# Patient Record
Sex: Female | Born: 1980 | Race: Black or African American | Hispanic: No | Marital: Single | Smoking: Never smoker
Health system: Southern US, Community
[De-identification: ages and names within clinical notes are randomized; demographics above are authoritative.]

## PROBLEM LIST (undated history)

## (undated) DIAGNOSIS — I10 Essential (primary) hypertension: Secondary | ICD-10-CM

## (undated) HISTORY — PX: OVARIAN CYST REMOVAL: SHX89

## (undated) HISTORY — PX: TUBAL LIGATION: SHX77

---
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
## 2013-10-20 ENCOUNTER — Emergency Department (HOSPITAL_BASED_OUTPATIENT_CLINIC_OR_DEPARTMENT_OTHER): Payer: Medicaid Other

## 2013-10-20 ENCOUNTER — Emergency Department (HOSPITAL_BASED_OUTPATIENT_CLINIC_OR_DEPARTMENT_OTHER)
Admission: EM | Admit: 2013-10-20 | Discharge: 2013-10-20 | Disposition: A | Discharge status: Home / Self Care | Payer: Medicaid Other | Attending: Emergency Medicine | Admitting: Emergency Medicine

## 2013-10-20 ENCOUNTER — Encounter (HOSPITAL_BASED_OUTPATIENT_CLINIC_OR_DEPARTMENT_OTHER): Payer: Self-pay | Admitting: Emergency Medicine

## 2013-10-20 DIAGNOSIS — I10 Essential (primary) hypertension: Secondary | ICD-10-CM | POA: Diagnosis not present

## 2013-10-20 DIAGNOSIS — Z79899 Other long term (current) drug therapy: Secondary | ICD-10-CM | POA: Insufficient documentation

## 2013-10-20 DIAGNOSIS — M25539 Pain in unspecified wrist: Secondary | ICD-10-CM | POA: Insufficient documentation

## 2013-10-20 DIAGNOSIS — M25531 Pain in right wrist: Secondary | ICD-10-CM

## 2013-10-20 DIAGNOSIS — Z880 Allergy status to penicillin: Secondary | ICD-10-CM | POA: Insufficient documentation

## 2013-10-20 DIAGNOSIS — Z791 Long term (current) use of non-steroidal anti-inflammatories (NSAID): Secondary | ICD-10-CM | POA: Insufficient documentation

## 2013-10-20 DIAGNOSIS — M25549 Pain in joints of unspecified hand: Secondary | ICD-10-CM | POA: Insufficient documentation

## 2013-10-20 HISTORY — DX: Essential (primary) hypertension: I10

## 2013-10-20 LAB — CBC WITH DIFFERENTIAL/PLATELET
Basophils Absolute: 0 10*3/uL (ref 0.0–0.1)
Basophils Relative: 0 % (ref 0–1)
Eosinophils Absolute: 0.1 10*3/uL (ref 0.0–0.7)
Eosinophils Relative: 1 % (ref 0–5)
HEMATOCRIT: 33 % — AB (ref 36.0–46.0)
HEMOGLOBIN: 10.6 g/dL — AB (ref 12.0–15.0)
LYMPHS PCT: 36 % (ref 12–46)
Lymphs Abs: 3.6 10*3/uL (ref 0.7–4.0)
MCH: 26.5 pg (ref 26.0–34.0)
MCHC: 32.1 g/dL (ref 30.0–36.0)
MCV: 82.5 fL (ref 78.0–100.0)
MONO ABS: 0.7 10*3/uL (ref 0.1–1.0)
MONOS PCT: 7 % (ref 3–12)
NEUTROS ABS: 5.5 10*3/uL (ref 1.7–7.7)
NEUTROS PCT: 55 % (ref 43–77)
Platelets: 299 10*3/uL (ref 150–400)
RBC: 4 MIL/uL (ref 3.87–5.11)
RDW: 14.6 % (ref 11.5–15.5)
WBC: 10 10*3/uL (ref 4.0–10.5)

## 2013-10-20 LAB — BASIC METABOLIC PANEL
ANION GAP: 11 (ref 5–15)
BUN: 8 mg/dL (ref 6–23)
CO2: 24 meq/L (ref 19–32)
CREATININE: 0.8 mg/dL (ref 0.50–1.10)
Calcium: 9.4 mg/dL (ref 8.4–10.5)
Chloride: 104 mEq/L (ref 96–112)
GFR calc Af Amer: >90 mL/min (ref >90)
GFR calc non Af Amer: >90 mL/min (ref >90)
Glucose, Bld: 110 mg/dL — ABNORMAL HIGH (ref 70–99)
POTASSIUM: 4.1 meq/L (ref 3.7–5.3)
Sodium: 139 mEq/L (ref 137–147)

## 2013-10-20 MED ORDER — NAPROXEN 500 MG PO TABS: 500.0000 mg | ORAL_TABLET | Freq: Two times a day (BID) | ORAL | Status: AC

## 2013-10-20 MED ORDER — HYDROCODONE-ACETAMINOPHEN 5-325 MG PO TABS: 1.0000 | ORAL_TABLET | Freq: Four times a day (QID) | ORAL | Status: AC | PRN

## 2013-10-20 MED ORDER — HYDROCODONE-ACETAMINOPHEN 5-325 MG PO TABS
1.0000 | ORAL_TABLET | Freq: Once | ORAL | Status: AC
  Administered 2013-10-20: 1 via ORAL
  Filled 2013-10-20: qty 1

## 2013-10-20 NOTE — ED Provider Notes (Signed)
CSN: [id number]     Arrival date & time 10/20/13  1256 History   First MD Initiated Contact with Patient 10/20/13 1303     Chief Complaint  Patient presents with  . Hand Pain   HPI Comments: Pain is characterized as burning and squeezing. Patient denies any injury or previous problems with plan.  Patient denies history of gout.  Patient drinks on the weekend and eats a diet high in fatty fried foods.  She states that she mostly eats fried chicken.  Patient is a 33 y.o. female presenting with hand pain. The history is provided by the patient. No language interpreter was used.  Hand Pain This is a new problem. The current episode started today. The problem occurs constantly. The problem has been gradually worsening. Associated symptoms include joint swelling. Pertinent negatives include no abdominal pain, arthralgias, change in bowel habit, chest pain, chills, diaphoresis, fatigue, fever, myalgias, nausea, neck pain, numbness, rash, vomiting or weakness. The symptoms are aggravated by bending (pressure, touching). She has tried NSAIDs and heat for the symptoms. The treatment provided mild relief.    Past Medical History  Diagnosis Date  . Hypertension    Past Surgical History  Procedure Laterality Date  . Tubal ligation     No family history on file. History  Substance Use Topics  . Smoking status: Never Smoker   . Smokeless tobacco: Not on file  . Alcohol Use: Yes   OB History   Grav Para Term Preterm Abortions TAB SAB Ect Mult Living                 Review of Systems  Constitutional: Negative for fever, chills, diaphoresis and fatigue.  Cardiovascular: Negative for chest pain.  Gastrointestinal: Negative for nausea, vomiting, abdominal pain and change in bowel habit.  Musculoskeletal: Positive for joint swelling. Negative for arthralgias, myalgias and neck pain.  Skin: Negative for rash.  Neurological: Negative for weakness and numbness.      Allergies   Penicillins  Home Medications   Prior to Admission medications   Medication Sig Start Date End Date Taking? Authorizing Provider  HYDROcodone-acetaminophen (NORCO/VICODIN) 5-325 MG per tablet Take 1 tablet by mouth every 6 (six) hours as needed for moderate pain or severe pain. 10/20/13   Canyon Lohr A Forcucci, PA-C  naproxen (NAPROSYN) 500 MG tablet Take 1 tablet (500 mg total) by mouth 2 (two) times daily. 10/20/13   Abou Sterkel A Forcucci, PA-C   BP 162/110  Pulse 82  Temp(Src) 98.8 F (37.1 C) (Oral)  Resp 18  Ht 5\' 1"  (1.549 m)  Wt 245 lb (111.131 kg)  BMI 46.32 kg/m2  SpO2 100%  LMP 10/05/2013 Physical Exam  Nursing note and vitals reviewed. Constitutional: She is oriented to person, place, and time. She appears well-developed and well-nourished. No distress.  HENT:  Head: Normocephalic and atraumatic.  Mouth/Throat: Oropharynx is clear and moist. No oropharyngeal exudate.  Eyes: Conjunctivae and EOM are normal. Pupils are equal, round, and reactive to light. No scleral icterus.  Neck: Normal range of motion. Neck supple. No JVD present. No thyromegaly present.  Cardiovascular: Normal rate, regular rhythm, normal heart sounds and intact distal pulses.  Exam reveals no gallop and no friction rub.   No murmur heard. Pulses:      Radial pulses are 2+ on the right side, and 2+ on the left side.  Pulmonary/Chest: Effort normal and breath sounds normal. No respiratory distress. She has no wheezes. She has no rales. She exhibits no  tenderness.  Musculoskeletal:       Right elbow: She exhibits normal range of motion, no swelling, no effusion, no deformity and no laceration. No tenderness found.       Right wrist: She exhibits decreased range of motion, tenderness and swelling. She exhibits no bony tenderness, no effusion, no crepitus, no deformity and no laceration.       Right hand: She exhibits decreased range of motion, tenderness and swelling. She exhibits no bony tenderness, normal  two-point discrimination, normal capillary refill, no deformity and no laceration. Normal sensation noted. Decreased sensation is not present in the ulnar distribution, is not present in the medial distribution and is not present in the radial distribution. Normal strength noted. She exhibits no finger abduction, no thumb/finger opposition and no wrist extension trouble.  Hand and wrist are without erythema and rash.  Wrist is mildly warm to the touch.  ROM is decreased due to pain but is able to be actively and passively moved.    Lymphadenopathy:    She has no cervical adenopathy.  Neurological: She is alert and oriented to person, place, and time.  Skin: Skin is warm and dry. No rash noted. She is not diaphoretic.  Psychiatric: She has a normal mood and affect. Her behavior is normal. Judgment and thought content normal.    ED Course  Procedures (including critical care time) Labs Review Labs Reviewed  CBC WITH DIFFERENTIAL - Abnormal; Notable for the following:    Hemoglobin 10.6 (*)    HCT 33.0 (*)    All other components within normal limits  BASIC METABOLIC PANEL - Abnormal; Notable for the following:    Glucose, Bld 110 (*)    All other components within normal limits    Imaging Review Dg Hand Complete Right  10/20/2013   CLINICAL DATA:  Posterior right hand pain.  Swelling.  EXAM: RIGHT HAND - COMPLETE 3+ VIEW  COMPARISON:  None.  FINDINGS: Imaged bones, joints and soft tissues appear normal. And. Negative exam.  IMPRESSION: Negative.   Electronically Signed   By: Drusilla Kanner M.D.   On: 10/20/2013 13:28     EKG Interpretation None      MDM   Final diagnoses:  Right wrist pain   Patient presents to the ED with right hand pain x 1 day.  Patient denies any injury.  Plain film xray here without any abnormalities at this time.  CBC shows no signs of leukocytosis at this time.  Given history of HTN and non-compliance of HTN medications BMP was checked which shows no signs  of abnormality at this time.  Differential at this time includes gout and septic joint.  Given ability to move the joint, non-toxic appearance, and normal white count suspect that there is low likelihood of septic joint.  Will treat the patient with a thumb spica brace for comfort.  Will treat as if gout and will given naproxen BID 500 mg and will give a handful of hydrocodone for pain relief.  Patient to follow-up with ortho at this time.  Patient was given strict return precautions of septic joint.  She states understanding and agreement to the above plan.  Patient was discussed with Dr. Wilkie Aye at this time who agrees with the above workup. Patient was encouraged to also follow-up with her PCP for further BP management.  Patient is stable at this time for discharge.      Eben Burow, PA-C 10/20/13 1523

## 2013-10-20 NOTE — ED Notes (Signed)
Pt reports pain to right hand and swelling. Denies injury.

## 2013-10-20 NOTE — Discharge Instructions (Signed)

## 2013-10-21 NOTE — ED Provider Notes (Signed)
Medical screening examination/treatment/procedure(s) were performed by non-physician practitioner and as supervising physician I was immediately available for consultation/collaboration.   EKG Interpretation None        Shon Batonourtney F Harwood Nall, MD 10/21/13 0700

## 2016-10-26 ENCOUNTER — Encounter (HOSPITAL_BASED_OUTPATIENT_CLINIC_OR_DEPARTMENT_OTHER): Payer: Self-pay | Admitting: *Deleted

## 2016-10-26 ENCOUNTER — Emergency Department (HOSPITAL_BASED_OUTPATIENT_CLINIC_OR_DEPARTMENT_OTHER)
Admission: EM | Admit: 2016-10-26 | Discharge: 2016-10-26 | Disposition: A | Discharge status: Home / Self Care | Payer: Medicaid Other | Attending: Emergency Medicine | Admitting: Emergency Medicine

## 2016-10-26 ENCOUNTER — Emergency Department (HOSPITAL_BASED_OUTPATIENT_CLINIC_OR_DEPARTMENT_OTHER): Payer: Medicaid Other

## 2016-10-26 DIAGNOSIS — R451 Restlessness and agitation: Secondary | ICD-10-CM | POA: Insufficient documentation

## 2016-10-26 DIAGNOSIS — F10920 Alcohol use, unspecified with intoxication, uncomplicated: Secondary | ICD-10-CM

## 2016-10-26 DIAGNOSIS — S0990XA Unspecified injury of head, initial encounter: Secondary | ICD-10-CM | POA: Diagnosis present

## 2016-10-26 DIAGNOSIS — I10 Essential (primary) hypertension: Secondary | ICD-10-CM | POA: Insufficient documentation

## 2016-10-26 DIAGNOSIS — Y999 Unspecified external cause status: Secondary | ICD-10-CM | POA: Diagnosis not present

## 2016-10-26 DIAGNOSIS — Y9301 Activity, walking, marching and hiking: Secondary | ICD-10-CM | POA: Diagnosis not present

## 2016-10-26 DIAGNOSIS — T07XXXA Unspecified multiple injuries, initial encounter: Secondary | ICD-10-CM

## 2016-10-26 DIAGNOSIS — S3992XA Unspecified injury of lower back, initial encounter: Secondary | ICD-10-CM | POA: Diagnosis not present

## 2016-10-26 DIAGNOSIS — S0181XA Laceration without foreign body of other part of head, initial encounter: Secondary | ICD-10-CM

## 2016-10-26 DIAGNOSIS — Y908 Blood alcohol level of 240 mg/100 ml or more: Secondary | ICD-10-CM | POA: Diagnosis not present

## 2016-10-26 DIAGNOSIS — Y929 Unspecified place or not applicable: Secondary | ICD-10-CM | POA: Insufficient documentation

## 2016-10-26 DIAGNOSIS — F10229 Alcohol dependence with intoxication, unspecified: Secondary | ICD-10-CM | POA: Diagnosis not present

## 2016-10-26 DIAGNOSIS — M25511 Pain in right shoulder: Secondary | ICD-10-CM | POA: Insufficient documentation

## 2016-10-26 DIAGNOSIS — S01111A Laceration without foreign body of right eyelid and periocular area, initial encounter: Secondary | ICD-10-CM | POA: Diagnosis not present

## 2016-10-26 DIAGNOSIS — W0110XA Fall on same level from slipping, tripping and stumbling with subsequent striking against unspecified object, initial encounter: Secondary | ICD-10-CM | POA: Insufficient documentation

## 2016-10-26 LAB — CBC WITH DIFFERENTIAL/PLATELET
Basophils Absolute: 0 10*3/uL (ref 0.0–0.1)
Basophils Relative: 0 %
Eosinophils Absolute: 0.1 10*3/uL (ref 0.0–0.7)
Eosinophils Relative: 1 %
HEMATOCRIT: 33.8 % — AB (ref 36.0–46.0)
Hemoglobin: 11 g/dL — ABNORMAL LOW (ref 12.0–15.0)
Lymphocytes Relative: 61 %
Lymphs Abs: 7.4 10*3/uL — ABNORMAL HIGH (ref 0.7–4.0)
MCH: 26.8 pg (ref 26.0–34.0)
MCHC: 32.5 g/dL (ref 30.0–36.0)
MCV: 82.4 fL (ref 78.0–100.0)
MONO ABS: 0.8 10*3/uL (ref 0.1–1.0)
MONOS PCT: 7 %
Neutro Abs: 3.8 10*3/uL (ref 1.7–7.7)
Neutrophils Relative %: 31 %
Platelets: 358 10*3/uL (ref 150–400)
RBC: 4.1 MIL/uL (ref 3.87–5.11)
RDW: 14.5 % (ref 11.5–15.5)
WBC: 12.1 10*3/uL — ABNORMAL HIGH (ref 4.0–10.5)

## 2016-10-26 LAB — BASIC METABOLIC PANEL
Anion gap: 10 (ref 5–15)
BUN: 12 mg/dL (ref 6–20)
CO2: 21 mmol/L — AB (ref 22–32)
CREATININE: 0.86 mg/dL (ref 0.44–1.00)
Calcium: 8.7 mg/dL — ABNORMAL LOW (ref 8.9–10.3)
Chloride: 105 mmol/L (ref 101–111)
GFR calc Af Amer: >60 mL/min (ref >60)
GFR calc non Af Amer: >60 mL/min (ref >60)
GLUCOSE: 111 mg/dL — AB (ref 65–99)
Potassium: 3.4 mmol/L — ABNORMAL LOW (ref 3.5–5.1)
Sodium: 136 mmol/L (ref 135–145)

## 2016-10-26 LAB — RAPID URINE DRUG SCREEN, HOSP PERFORMED
Amphetamines: NOT DETECTED
BARBITURATES: NOT DETECTED
Benzodiazepines: NOT DETECTED
Cocaine: NOT DETECTED
Opiates: NOT DETECTED
Tetrahydrocannabinol: NOT DETECTED

## 2016-10-26 LAB — ETHANOL: Alcohol, Ethyl (B): 245 mg/dL — ABNORMAL HIGH (ref <5)

## 2016-10-26 LAB — SALICYLATE LEVEL: Salicylate Lvl: <7 mg/dL (ref 2.8–30.0)

## 2016-10-26 LAB — PREGNANCY, URINE: PREG TEST UR: NEGATIVE

## 2016-10-26 LAB — ACETAMINOPHEN LEVEL

## 2016-10-26 MED ORDER — TRAMADOL HCL 50 MG PO TABS: 50.0000 mg | ORAL_TABLET | Freq: Four times a day (QID) | ORAL | 0 refills | Status: AC | PRN

## 2016-10-26 MED ORDER — TETANUS-DIPHTH-ACELL PERTUSSIS 5-2.5-18.5 LF-MCG/0.5 IM SUSP
0.5000 mL | Freq: Once | INTRAMUSCULAR | Status: AC
  Administered 2016-10-26: 0.5 mL via INTRAMUSCULAR
  Filled 2016-10-26: qty 0.5

## 2016-10-26 MED ORDER — LIDOCAINE-EPINEPHRINE-TETRACAINE (LET) SOLUTION
3.0000 mL | Freq: Once | NASAL | Status: AC
  Administered 2016-10-26: 3 mL via TOPICAL
  Filled 2016-10-26: qty 3

## 2016-10-26 MED ORDER — ACETAMINOPHEN 500 MG PO TABS
1000.0000 mg | ORAL_TABLET | Freq: Once | ORAL | Status: AC
  Administered 2016-10-26: 1000 mg via ORAL
  Filled 2016-10-26: qty 2

## 2016-10-26 MED ORDER — ONDANSETRON HCL 4 MG/2ML IJ SOLN
4.0000 mg | Freq: Once | INTRAMUSCULAR | Status: AC
  Administered 2016-10-26: 4 mg via INTRAVENOUS
  Filled 2016-10-26: qty 2

## 2016-10-26 MED ORDER — LIDOCAINE VISCOUS 2 % MT SOLN
15.0000 mL | Freq: Once | OROMUCOSAL | Status: AC
  Administered 2016-10-26: 15 mL via OROMUCOSAL
  Filled 2016-10-26: qty 15

## 2016-10-26 MED ORDER — SODIUM CHLORIDE 0.9 % IV BOLUS (SEPSIS)
1000.0000 mL | Freq: Once | INTRAVENOUS | Status: AC
  Administered 2016-10-26: 1000 mL via INTRAVENOUS

## 2016-10-26 MED ORDER — LIDOCAINE-EPINEPHRINE 2 %-1:100000 IJ SOLN
INTRAMUSCULAR | Status: AC
  Administered 2016-10-26: 1 mL
  Filled 2016-10-26: qty 1

## 2016-10-26 MED ORDER — IOPAMIDOL (ISOVUE-300) INJECTION 61%
100.0000 mL | Freq: Once | INTRAVENOUS | Status: AC | PRN
  Administered 2016-10-26: 100 mL via INTRAVENOUS

## 2016-10-26 MED ORDER — KETOROLAC TROMETHAMINE 30 MG/ML IJ SOLN: 15.0000 mg | Freq: Once | INTRAMUSCULAR | Status: DC

## 2016-10-26 MED ORDER — SODIUM CHLORIDE 0.9 % IV BOLUS (SEPSIS)
1000.0000 mL | Freq: Once | INTRAVENOUS | Status: AC
Start: 2016-10-26 — End: 2016-10-26
  Administered 2016-10-26: 1000 mL via INTRAVENOUS

## 2016-10-26 MED ORDER — SULFAMETHOXAZOLE-TRIMETHOPRIM 800-160 MG PO TABS: 1.0000 | ORAL_TABLET | Freq: Two times a day (BID) | ORAL | 0 refills | Status: AC

## 2016-10-26 MED ORDER — HALOPERIDOL LACTATE 5 MG/ML IJ SOLN
2.0000 mg | Freq: Once | INTRAMUSCULAR | Status: AC
  Administered 2016-10-26: 2 mg via INTRAVENOUS
  Filled 2016-10-26: qty 1

## 2016-10-26 NOTE — ED Notes (Signed)
Up to b/r with friend, no changes.

## 2016-10-26 NOTE — ED Notes (Signed)
Patient awake, calm, able to ambulate to bathroom with steady gait, reports feels much better and requesting to be dc home. Updated Dr. Rubin Payor. Patient able to drink Sprite without nausea, or emesis.

## 2016-10-26 NOTE — ED Notes (Signed)
Patient is currently asleep. VSS - friend at the bedside made more comfortable

## 2016-10-26 NOTE — ED Notes (Signed)
Pt agitated with EDP questions. Pt walking out of exam room, wanting IV removed, wanting to leave. Friends x2 with pt assisting with encouraging pt to stay to have laceration repaired. Pt's mother called by friend on speaker phone, mother encouraging pt to stay. Pt reluctantly agreeable and walked from w/r back to exam room. Friend remains with pt.

## 2016-10-26 NOTE — ED Provider Notes (Signed)
  Physical Exam  BP 101/65   Pulse 100   Temp 98.5 F (36.9 C) (Oral)   Resp 18   LMP 10/27/2014 (Approximate)   SpO2 97%   Physical Exam  ED Course  Procedures  MDM Patient is more awake and has ambulated the bathroom. Has been monitored for 6 hours. Mental status improved. Denies any ingestions. Will discharge       Benjiman Core, MD 10/26/16 1014

## 2016-10-26 NOTE — ED Notes (Addendum)
EDP at [facility]. Pt arousable to voice. No changes, VSS. Pt alert, NAD, calm, interactive, cooperative. Pt denies swallowing any objects or foreign bodies. Denies possibility of accidentally swallowing plastic, glass, styrofoam or other material. Up to b/r to void, friend with pt. Slow, cautious, steady gait.

## 2016-10-26 NOTE — ED Notes (Signed)
Assumed care of patient from [place], [place]. Pt sleeping at this time. On monitoring device. No distress. Friend at bedside.
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
## 2016-10-26 NOTE — ED Notes (Addendum)
Per Judeth Cornfield at [facility] [phone]), recommends "for possible heroin ingestion":  Symptomatic and supportive care.  Charcoal if tolerated. Observe for 12 hours from the 04:19 haldol 2mg .  Trend VS. Monitor to baseline and sobriety. Recovery of possible packet not necessary. Benzos not recommended. Dispo should have normal exam including rectal exam.  Dr. Nicanor Alcon notified.
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
## 2016-10-26 NOTE — ED Notes (Addendum)
Pt resting/sleeping in stretcher, NAD, calm, friend at [facility]. Wait/ delay explained.

## 2016-10-26 NOTE — ED Notes (Signed)
Back from b/r, no changes. EDP at [facility] to suture facial lac.
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
## 2016-10-26 NOTE — ED Notes (Signed)
Patient denies pain and is resting comfortably.  

## 2016-10-26 NOTE — ED Notes (Signed)
Back from CT, NAD, calmer, sleeping (recent haldol), VSS, friend at [facility].

## 2016-10-26 NOTE — ED Notes (Signed)
EDP at [facility] suturing.

## 2016-10-26 NOTE — ED Notes (Addendum)
To CT on stretcher, friend present. No changes. Alert, NAD, calmer.

## 2016-10-27 LAB — PATHOLOGIST SMEAR REVIEW: PATH REVIEW: REACTIVE

## 2017-12-19 ENCOUNTER — Other Ambulatory Visit: Payer: Self-pay

## 2017-12-19 ENCOUNTER — Emergency Department (HOSPITAL_BASED_OUTPATIENT_CLINIC_OR_DEPARTMENT_OTHER): Payer: Medicaid Other

## 2017-12-19 ENCOUNTER — Encounter (HOSPITAL_BASED_OUTPATIENT_CLINIC_OR_DEPARTMENT_OTHER): Payer: Self-pay

## 2017-12-19 ENCOUNTER — Emergency Department (HOSPITAL_BASED_OUTPATIENT_CLINIC_OR_DEPARTMENT_OTHER)
Admission: EM | Admit: 2017-12-19 | Discharge: 2017-12-19 | Disposition: A | Discharge status: Home / Self Care | Payer: Medicaid Other | Attending: Emergency Medicine | Admitting: Emergency Medicine

## 2017-12-19 DIAGNOSIS — W010XXA Fall on same level from slipping, tripping and stumbling without subsequent striking against object, initial encounter: Secondary | ICD-10-CM | POA: Insufficient documentation

## 2017-12-19 DIAGNOSIS — Z79899 Other long term (current) drug therapy: Secondary | ICD-10-CM | POA: Diagnosis not present

## 2017-12-19 DIAGNOSIS — Y999 Unspecified external cause status: Secondary | ICD-10-CM | POA: Diagnosis not present

## 2017-12-19 DIAGNOSIS — Y939 Activity, unspecified: Secondary | ICD-10-CM | POA: Diagnosis not present

## 2017-12-19 DIAGNOSIS — M25562 Pain in left knee: Secondary | ICD-10-CM | POA: Diagnosis present

## 2017-12-19 DIAGNOSIS — Y929 Unspecified place or not applicable: Secondary | ICD-10-CM | POA: Diagnosis not present

## 2017-12-19 DIAGNOSIS — I10 Essential (primary) hypertension: Secondary | ICD-10-CM | POA: Insufficient documentation

## 2017-12-19 MED ORDER — ACETAMINOPHEN 500 MG PO TABS
1000.0000 mg | ORAL_TABLET | Freq: Once | ORAL | Status: AC
  Administered 2017-12-19: 1000 mg via ORAL
  Filled 2017-12-19: qty 2

## 2017-12-19 MED ORDER — LIDOCAINE 5 % EX PTCH: 1.0000 | MEDICATED_PATCH | CUTANEOUS | 0 refills | Status: AC

## 2017-12-19 NOTE — ED Provider Notes (Signed)
[facility] EMERGENCY DEPARTMENT Provider Note   CSN: [id number] Arrival date & time: 12/19/17  0144     History   Chief Complaint Chief Complaint  Patient presents with  . Knee Pain    HPI Allison Arnold is a 37 y.o. female.  The history is provided by the patient and a friend.  Knee Pain   This is a new problem. The current episode started less than 1 hour ago. The problem occurs constantly. The problem has not changed since onset.The pain is present in the left knee (left knee, fell directly onto it). The quality of the pain is described as aching. The pain is severe. Pertinent negatives include no numbness, full range of motion, no stiffness, no tingling and no itching. The symptoms are aggravated by activity. She has tried nothing for the symptoms. The treatment provided no relief. History of extremity trauma: slipped and fell directly onto the knee. Family history is significant for no rheumatoid arthritis.    Past Medical History:  Diagnosis Date  . Hypertension     There are no active problems to display for this patient.   Past Surgical History:  Procedure Laterality Date  . OVARIAN CYST REMOVAL    . TUBAL LIGATION       OB History   None      Home Medications    Prior to Admission medications   Medication Sig Start Date End Date Taking? Authorizing Provider  HYDROcodone-acetaminophen (NORCO/VICODIN) 5-325 MG per tablet Take 1 tablet by mouth every 6 (six) hours as needed for moderate pain or severe pain. 10/20/13   Forcucci, Courtney, PA-C  lidocaine (LIDODERM) 5 % Place 1 patch onto the skin daily. Remove & Discard patch within 12 hours or as directed by MD 12/19/17   Nicanor Alcon, Min Tunnell, MD  naproxen (NAPROSYN) 500 MG tablet Take 1 tablet (500 mg total) by mouth 2 (two) times daily. 10/20/13   Forcucci, Courtney, PA-C  traMADol (ULTRAM) 50 MG tablet Take 1 tablet (50 mg total) by mouth every 6 (six) hours as needed. 10/26/16   Mearl Harewood, MD     Family History No family history on file.  Social History Social History   Tobacco Use  . Smoking status: Never Smoker  . Smokeless tobacco: Never Used  Substance Use Topics  . Alcohol use: Yes  . Drug use: No     Allergies   Penicillins   Review of Systems Review of Systems  Eyes: Negative for visual disturbance.  Respiratory: Negative for shortness of breath.   Gastrointestinal: Negative for nausea and vomiting.  Musculoskeletal: Positive for arthralgias. Negative for back pain, neck pain, neck stiffness and stiffness.  Skin: Negative for itching.  Neurological: Negative for tingling, seizures, weakness and numbness.  All other systems reviewed and are negative.    Physical Exam Updated Vital Signs BP 100/64 (BP Location: Right Arm)   Pulse 70   Temp 98 F (36.7 C) (Oral)   Resp 16   SpO2 99%   Physical Exam  Constitutional: She is oriented to person, place, and time. She appears well-developed and well-nourished. No distress.  HENT:  Head: Normocephalic and atraumatic.  Right Ear: External ear normal.  Left Ear: External ear normal.  Mouth/Throat: Oropharynx is clear and moist. No oropharyngeal exudate.  Eyes: Pupils are equal, round, and reactive to light. Conjunctivae are normal.  Neck: Normal range of motion. Neck supple.  Cardiovascular: Normal rate, regular rhythm, normal heart sounds and intact distal pulses.  Pulmonary/Chest:  Effort normal and breath sounds normal. No stridor. She has no wheezes. She has no rales.  Abdominal: Soft. Bowel sounds are normal. She exhibits no mass. There is no tenderness. There is no rebound and no guarding.  Musculoskeletal: Normal range of motion.       Right knee: Normal.       Left knee: She exhibits normal range of motion, no swelling, no effusion, no ecchymosis, no deformity, no laceration, no erythema, normal alignment, no LCL laxity, normal patellar mobility, no bony tenderness, normal meniscus and no MCL  laxity. No tenderness found. No medial joint line, no lateral joint line, no MCL, no LCL and no patellar tendon tenderness noted.       Left ankle: Normal. Achilles tendon normal.       Left lower leg: Normal.       Left foot: Normal.  Neurological: She is alert and oriented to person, place, and time.     ED Treatments / Results  Labs (all labs ordered are listed, but only abnormal results are displayed) Labs Reviewed  PREGNANCY, URINE    EKG None  Radiology Dg Knee Complete 4 Views Left  Result Date: 12/19/2017 CLINICAL DATA:  Slip and fall injury. Left knee pain and unable to bear weight. EXAM: LEFT KNEE - COMPLETE 4+ VIEW COMPARISON:  None. FINDINGS: No evidence of fracture, dislocation, or joint effusion. No evidence of arthropathy or other focal bone abnormality. Soft tissues are unremarkable. IMPRESSION: Negative. Electronically Signed   By: Burman Nieves M.D.   On: 12/19/2017 02:41    Procedures Procedures (including critical care time)  Medications Ordered in ED Medications  acetaminophen (TYLENOL) tablet 1,000 mg (1,000 mg Oral Given 12/19/17 0301)       Final Clinical Impressions(s) / ED Diagnoses   Final diagnoses:  Acute pain of left knee   Return for weakness, numbness, changes in vision or speech, fevers >100.4 unrelieved by medication, shortness of breath, intractable vomiting, or diarrhea, abdominal pain, Inability to tolerate liquids or food, cough, altered mental status or any concerns. No signs of systemic illness or infection. The patient is nontoxic-appearing on exam and vital signs are within normal limits.    I have reviewed the triage vital signs and the nursing notes. Pertinent labs &imaging results that were available during my care of the patient were reviewed by me and considered in my medical decision making (see chart for details).  After history, exam, and medical workup I feel the patient has been appropriately medically screened  and is safe for discharge home. Pertinent diagnoses were discussed with the patient. Patient was given return precautions. ED Discharge Orders         Ordered    lidocaine (LIDODERM) 5 %  Every 24 hours     12/19/17 0306           Shonda Mandarino, MD 12/19/17 5643

## 2017-12-19 NOTE — ED Notes (Signed)
Pt transported to XR via wheelchair at this time.   

## 2017-12-19 NOTE — ED Triage Notes (Signed)
Left knee pain after fall

## 2017-12-19 NOTE — ED Notes (Signed)
Patient transported to X-ray 

## 2019-08-07 IMAGING — CR DG KNEE COMPLETE 4+V*L*
4 series · 4 of 4 positions shown · non-contrast
Comparison: None.

CLINICAL DATA: Slip and fall injury. Left knee pain and unable to
bear weight.

EXAM:
LEFT KNEE - COMPLETE 4+ VIEW

[t knee oblique left * (1 of 2)]
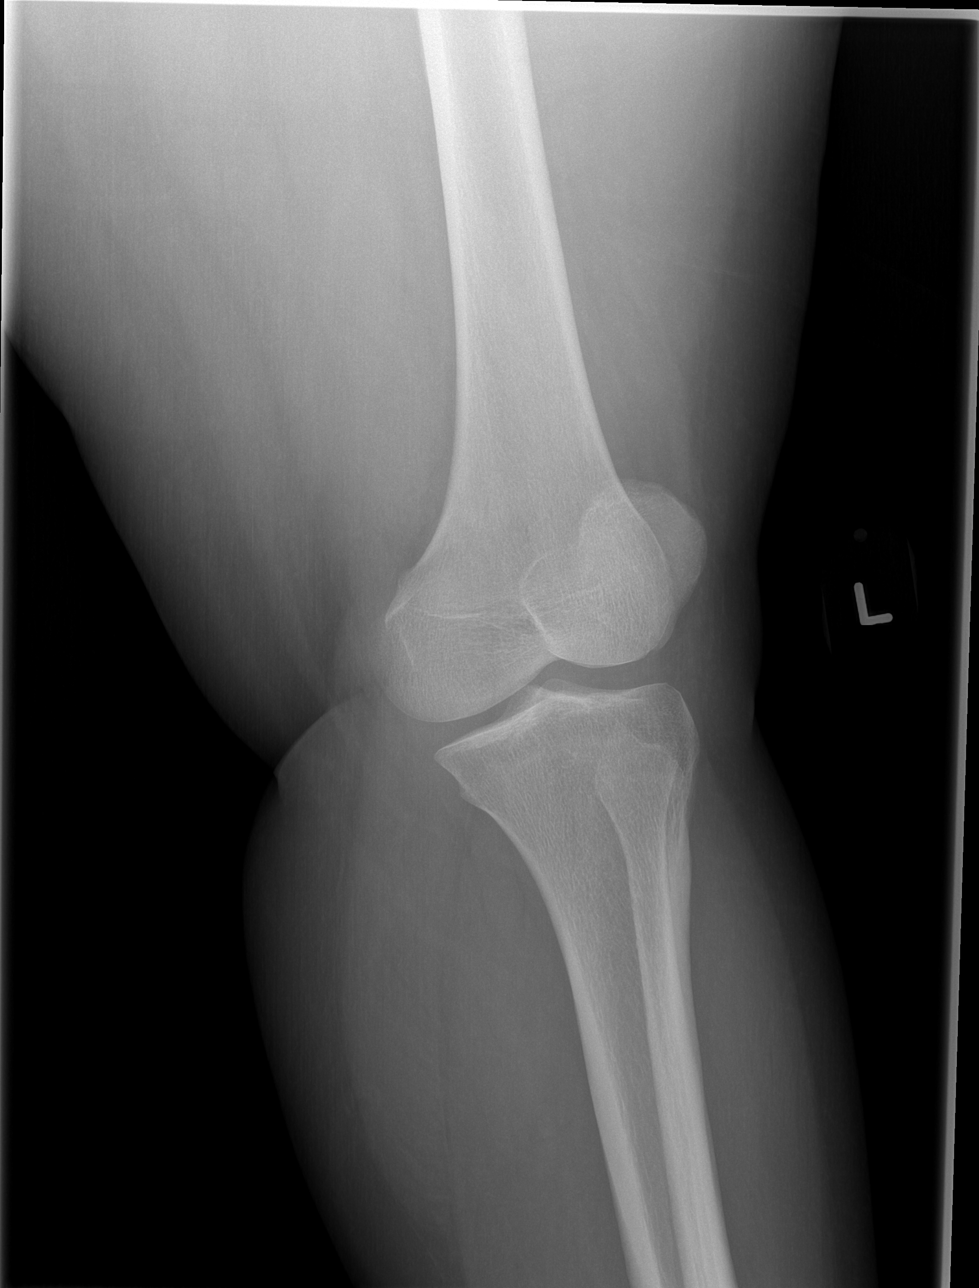

[t knee ap left]
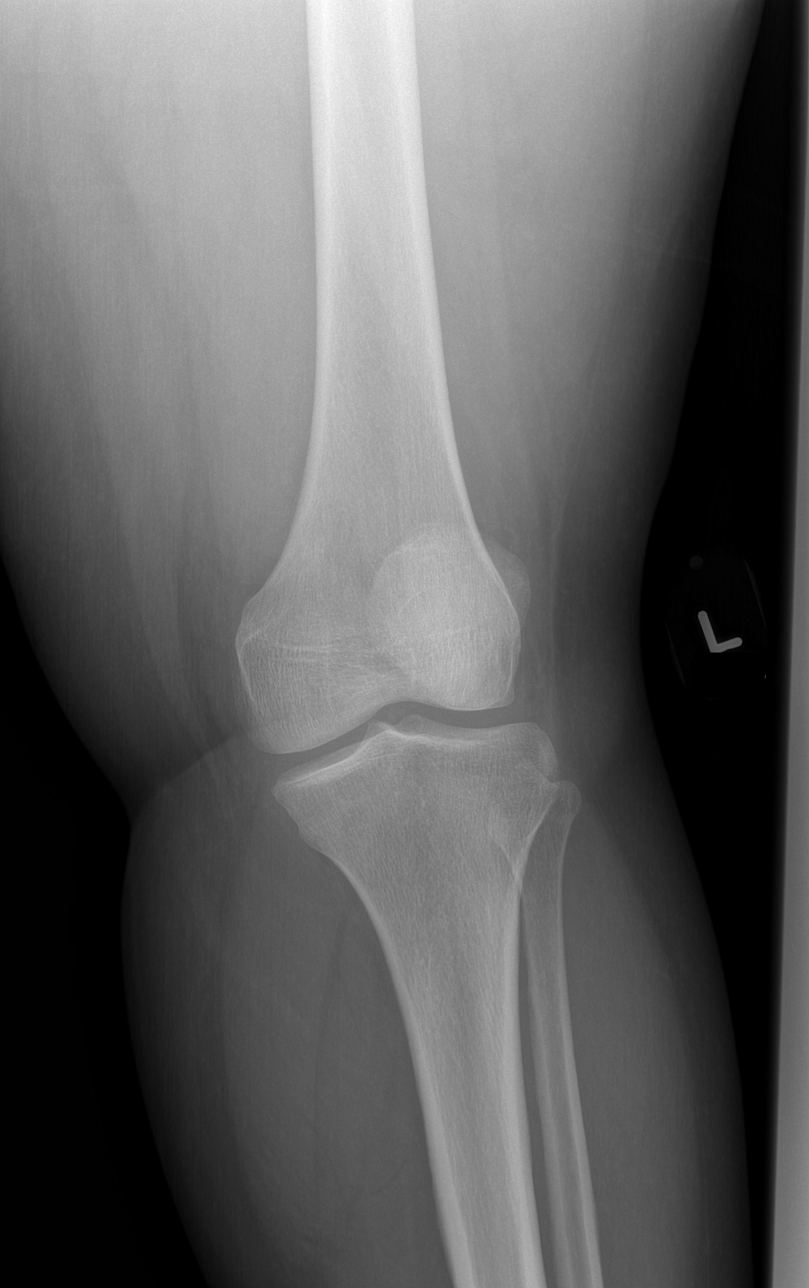

[t knee oblique left * (2 of 2)]
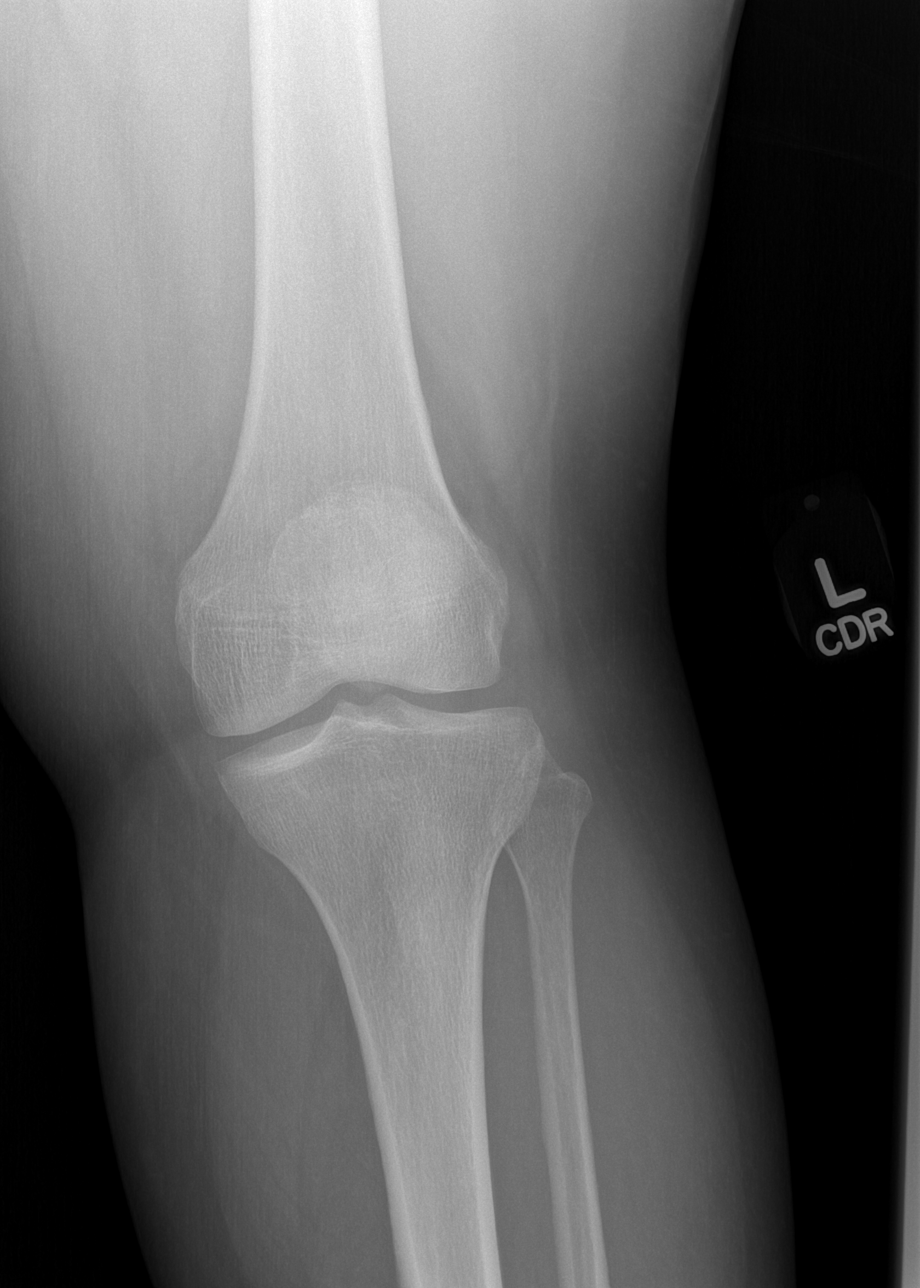

[t knee lat left *]
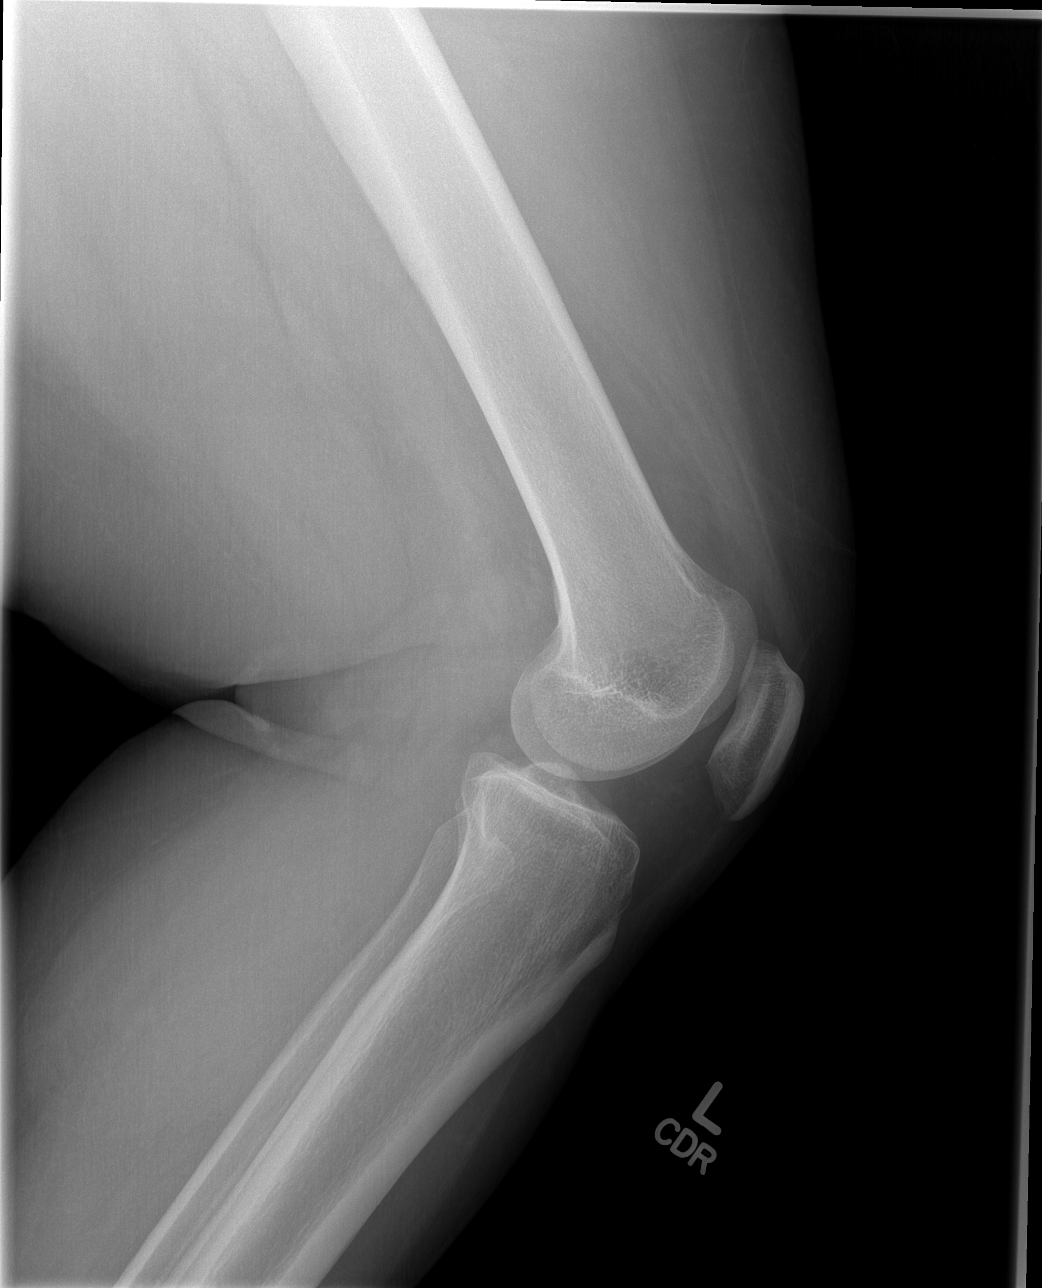

[4 of 4 positions shown; findings below may reference images not displayed]

FINDINGS: No evidence of fracture, dislocation, or joint effusion. No evidence
of arthropathy or other focal bone abnormality. Soft tissues are
unremarkable.
IMPRESSION: Negative.
